# Patient Record
Sex: Male | Born: 1990 | Race: White | Hispanic: No | Smoking: Never smoker
Health system: Southern US, Community
[De-identification: ages and names within clinical notes are randomized; demographics above are authoritative.]

## PROBLEM LIST (undated history)

## (undated) HISTORY — PX: WISDOM TOOTH EXTRACTION: SHX21

---

## 2019-06-28 ENCOUNTER — Emergency Department (HOSPITAL_COMMUNITY)
Admission: EM | Admit: 2019-06-28 | Discharge: 2019-06-28 | Disposition: A | Discharge status: Home / Self Care | Payer: Medicaid Other | Attending: Emergency Medicine | Admitting: Emergency Medicine

## 2019-06-28 ENCOUNTER — Other Ambulatory Visit: Payer: Self-pay

## 2019-06-28 ENCOUNTER — Emergency Department (HOSPITAL_COMMUNITY): Payer: Medicaid Other

## 2019-06-28 ENCOUNTER — Encounter (HOSPITAL_COMMUNITY): Payer: Self-pay | Admitting: Emergency Medicine

## 2019-06-28 DIAGNOSIS — Y999 Unspecified external cause status: Secondary | ICD-10-CM | POA: Diagnosis not present

## 2019-06-28 DIAGNOSIS — Y929 Unspecified place or not applicable: Secondary | ICD-10-CM | POA: Diagnosis not present

## 2019-06-28 DIAGNOSIS — S99921A Unspecified injury of right foot, initial encounter: Secondary | ICD-10-CM

## 2019-06-28 DIAGNOSIS — S93114A Dislocation of interphalangeal joint of right lesser toe(s), initial encounter: Secondary | ICD-10-CM | POA: Diagnosis not present

## 2019-06-28 DIAGNOSIS — Y9301 Activity, walking, marching and hiking: Secondary | ICD-10-CM | POA: Insufficient documentation

## 2019-06-28 DIAGNOSIS — Y29XXXA Contact with blunt object, undetermined intent, initial encounter: Secondary | ICD-10-CM | POA: Diagnosis not present

## 2019-06-28 DIAGNOSIS — T1490XA Injury, unspecified, initial encounter: Secondary | ICD-10-CM

## 2019-06-28 MED ORDER — OXYCODONE-ACETAMINOPHEN 5-325 MG PO TABS
1.0000 | ORAL_TABLET | Freq: Once | ORAL | Status: AC
  Administered 2019-06-28: 20:00:00 1 via ORAL
  Filled 2019-06-28: qty 1

## 2019-06-28 MED ORDER — LIDOCAINE HCL (PF) 1 % IJ SOLN
10.0000 mL | Freq: Once | INTRAMUSCULAR | Status: DC
  Filled 2019-06-28: qty 30

## 2019-06-28 NOTE — ED Provider Notes (Signed)
[name]-EMERGENCY DEPT Provider Note   CSN: [id number] Arrival date & time: 06/28/19  1628     History Chief Complaint  Patient presents with  . Toe Injury    Corey Buck is a 29 y.o. male.  Corey Buck is a 29 year old man who presents to the ED with an injury to a toe in his right foot.  He reports that he accidentally kicked a table leg earlier today and his toe seems to be out of alignment.  He is suspicious that he may have dislocated his toe and he wants to make sure that it is not broken.  He has no other concerns at this time.        History reviewed. No pertinent past medical history.  There are no problems to display for this patient.   Past Surgical History:  Procedure Laterality Date  . WISDOM TOOTH EXTRACTION         No family history on file.  Social History   Tobacco Use  . Smoking status: Not on file  Substance Use Topics  . Alcohol use: Not on file  . Drug use: Not on file    Home Medications Prior to Admission medications   Not on File    Allergies    Patient has no known allergies.  Review of Systems   Review of Systems  All other systems reviewed and are negative.   Physical Exam Updated Vital Signs BP 126/70   Pulse 63   Temp 98.1 F (36.7 C) (Oral)   Resp 16   SpO2 100%   Physical Exam Constitutional:      Appearance: Normal appearance. He is normal weight.  HENT:     Head: Normocephalic and atraumatic.     Nose: Nose normal.     Mouth/Throat:     Mouth: Mucous membranes are moist.     Pharynx: Oropharynx is clear.  Eyes:     Conjunctiva/sclera: Conjunctivae normal.     Pupils: Pupils are equal, round, and reactive to light.  Pulmonary:     Effort: Pulmonary effort is normal. No respiratory distress.  Musculoskeletal:        General: Deformity present. No swelling.     Cervical back: Normal range of motion and neck supple.     Right lower leg: No edema.     Comments: The fourth toe of  his right foot shows some superior displacement.  No evidence of broken skin, erythema, swelling.  Skin:    General: Skin is warm.  Neurological:     General: No focal deficit present.     Mental Status: He is alert. Mental status is at baseline.     ED Results / Procedures / Treatments   Labs (all labs ordered are listed, but only abnormal results are displayed) Labs Reviewed - No data to display  EKG None  Radiology DG Foot Complete Right  Result Date: 06/28/2019 CLINICAL DATA:  Right fourth toe pain EXAM: RIGHT FOOT COMPLETE - 3+ VIEW COMPARISON:  None. FINDINGS: Abnormal appearance of the fourth digit proximal interphalangeal joint which appears to be dorsally subluxed, possibly dislocated. There is a suspected fracture at the base of the fourth middle phalanx. Remaining osseous structures appear intact and unremarkable. There is soft tissue swelling of the fourth digit. No arthropathy. IMPRESSION: Abnormal appearance of the fourth digit PIP joint which appears to be dorsally subluxed, possibly dislocated. Suspected fracture at the base of the fourth middle phalanx. Dedicated radiographs of the  fourth toe would be helpful to further characterize. Electronically Signed   By: Davina Poke D.O.   On: 06/28/2019 16:52   DG Toe 4th Right  Result Date: 06/28/2019 CLINICAL DATA:  Fourth toe injury today.  Post reduction. EXAM: RIGHT FOURTH TOE COMPARISON:  Earlier same day. FINDINGS: There is been reduction of the previously seen subluxation at the fourth PIP joint with current normal alignment over the joint. No evidence of fracture. IMPRESSION: Normal alignment post reduction of previously seen subluxation at the fourth PIP joint. No fracture. Electronically Signed   By: Marin Olp M.D.   On: 06/28/2019 19:52    Procedures Procedures (including critical care time)  Medications Ordered in ED Medications  oxyCODONE-acetaminophen (PERCOCET/ROXICET) 5-325 MG per tablet 1 tablet (1  tablet Oral Given 06/28/19 1939)    ED Course  I have reviewed the triage vital signs and the nursing notes.  Pertinent labs & imaging results that were available during my care of the patient were reviewed by me and considered in my medical decision making (see chart for details).    MDM Rules/Calculators/A&P                      Mr. Ocallaghan presents to the ED with a right toe injury.  Initial x-rays showed dislocation and were inconclusive for fracture.  The dislocation was reduced and repeat x-rays showed normal orientation of his right fourth toe with no evidence of fracture.  His toe was buddy taped and he was instructed to wear stiff soled shoes for the next several days.  Final Clinical Impression(s) / ED Diagnoses Final diagnoses:  Injury  Injury of toe on right foot, initial encounter    Rx / DC Orders ED Discharge Orders    None       Matilde Haymaker, MD 06/28/19 2010    Margette Fast, MD 07/02/19 [phone]

## 2019-06-28 NOTE — ED Triage Notes (Signed)
Pt reports hit right 4th toe on table today. Believes is broken.

## 2019-06-28 NOTE — Discharge Instructions (Signed)
Our x-rays show that there is no evidence of fracture of your toe.  We were able to correct the alignment of your toe, it is no longer dislocated.  At home, you can use Tylenol and Motrin for discomfort, you will likely have discomfort for the next several days.

## 2019-07-02 NOTE — ED Provider Notes (Signed)
Reduction of dislocation  Date/Time: 07/02/2019 7:20 AM Performed by: Maia Plan, MD Authorized by: Maia Plan, MD  Consent: Verbal consent obtained. Risks and benefits: risks, benefits and alternatives were discussed Consent given by: patient Time out: Immediately prior to procedure a "time out" was called to verify the correct patient, procedure, equipment, support staff and site/side marked as required. Local anesthesia used: no  Anesthesia: Local anesthesia used: no  Sedation: Patient sedated: no  Patient tolerance: patient tolerated the procedure well with no immediate complications Comments: Inline traction applied tot he toe with palpable reduction and improved deformity. Joint reduction confirmed on repeat films.        Maia Plan, MD 07/02/19 [phone]

## 2021-03-05 ENCOUNTER — Other Ambulatory Visit: Payer: Self-pay

## 2021-03-05 ENCOUNTER — Emergency Department (HOSPITAL_COMMUNITY)
Admission: EM | Admit: 2021-03-05 | Discharge: 2021-03-05 | Disposition: A | Discharge status: Home / Self Care | Payer: Medicaid Other | Attending: Emergency Medicine | Admitting: Emergency Medicine

## 2021-03-05 ENCOUNTER — Encounter (HOSPITAL_COMMUNITY): Payer: Self-pay

## 2021-03-05 DIAGNOSIS — Z20822 Contact with and (suspected) exposure to covid-19: Secondary | ICD-10-CM | POA: Diagnosis not present

## 2021-03-05 DIAGNOSIS — J101 Influenza due to other identified influenza virus with other respiratory manifestations: Secondary | ICD-10-CM | POA: Insufficient documentation

## 2021-03-05 DIAGNOSIS — R509 Fever, unspecified: Secondary | ICD-10-CM | POA: Diagnosis present

## 2021-03-05 LAB — RESP PANEL BY RT-PCR (FLU A&B, COVID) ARPGX2
Influenza A by PCR: POSITIVE — AB
Influenza B by PCR: NEGATIVE
SARS Coronavirus 2 by RT PCR: NEGATIVE

## 2021-03-05 MED ORDER — OSELTAMIVIR PHOSPHATE 75 MG PO CAPS: 75.0000 mg | ORAL_CAPSULE | Freq: Two times a day (BID) | ORAL | 0 refills | Status: AC

## 2021-03-05 NOTE — ED Triage Notes (Signed)
Pt c/o cough, sore throat, headache, chills, and body aches.

## 2021-03-05 NOTE — Discharge Instructions (Signed)
Your flu a positive.  I have sent in Tamiflu to your pharmacy.  As discussed continue drinking plenty of fluids.  Take Tylenol and Motrin to keep fever under control.  If you develop nausea and vomiting to the point you are unable to keep any food or drink down despite taking Zofran please return to the emergency room.  We did discuss sending in Zofran to your pharmacy which you stated you have plenty on hand.

## 2021-03-05 NOTE — ED Provider Notes (Signed)
[facility]-EMERGENCY DEPT Provider Note   CSN: [id number] Arrival date & time: 03/05/21  1307     History Chief Complaint  Patient presents with   Headache   Sore Throat   Cough   Generalized Body Aches    Corey Buck is a 30 y.o. male.  30 year old male presents today for evaluation of fever, myalgias, headache, sore throat of 1 day duration.  Patient denies any known recent sick contacts.  He denies vomiting.  He reports adequate hydration with 4 16 ounce bottles.  He does report mild nausea for which he took Zofran that he has at home.  He denies any chest pain, shortness of breath, lightheadedness.  He has not taken anything over-the-counter for this.  The history is provided by the patient. No language interpreter was used.      History reviewed. No pertinent past medical history.  There are no problems to display for this patient.   Past Surgical History:  Procedure Laterality Date   WISDOM TOOTH EXTRACTION         History reviewed. No pertinent family history.  Social History   Tobacco Use   Smoking status: Never   Smokeless tobacco: Never  Substance Use Topics   Alcohol use: Not Currently    Home Medications Prior to Admission medications   Not on File    Allergies    Patient has no known allergies.  Review of Systems   Review of Systems  Constitutional:  Negative for chills and fever.  Cardiovascular:  Negative for chest pain.  Gastrointestinal:  Positive for nausea. Negative for abdominal pain and vomiting.  Musculoskeletal:  Positive for myalgias.  Neurological:  Negative for weakness and light-headedness.  All other systems reviewed and are negative.  Physical Exam Updated Vital Signs BP 121/64   Pulse 89   Temp 99.4 F (37.4 C) (Oral)   Resp 16   SpO2 97%   Physical Exam Vitals and nursing note reviewed.  Constitutional:      General: He is not in acute distress.    Appearance: Normal appearance. He is  not ill-appearing.  HENT:     Head: Normocephalic and atraumatic.     Nose: Nose normal.  Eyes:     General: No scleral icterus.    Extraocular Movements: Extraocular movements intact.     Conjunctiva/sclera: Conjunctivae normal.  Cardiovascular:     Rate and Rhythm: Normal rate and regular rhythm.     Pulses: Normal pulses.     Heart sounds: Normal heart sounds.  Pulmonary:     Effort: Pulmonary effort is normal. No respiratory distress.     Breath sounds: Normal breath sounds. No wheezing or rales.  Abdominal:     General: There is no distension.     Tenderness: There is no abdominal tenderness.  Musculoskeletal:        General: Normal range of motion.     Cervical back: Normal range of motion.     Right lower leg: No edema.     Left lower leg: No edema.  Skin:    General: Skin is warm and dry.  Neurological:     General: No focal deficit present.     Mental Status: He is alert. Mental status is at baseline.    ED Results / Procedures / Treatments   Labs (all labs ordered are listed, but only abnormal results are displayed) Labs Reviewed  RESP PANEL BY RT-PCR (FLU A&B, COVID) [id number]    EKG  None  Radiology No results found.  Procedures Procedures   Medications Ordered in ED Medications - No data to display  ED Course  I have reviewed the triage vital signs and the nursing notes.  Pertinent labs & imaging results that were available during my care of the patient were reviewed by me and considered in my medical decision making (see chart for details).    MDM Rules/Calculators/A&P                           30 year old male presents with congestion, myalgias, headache, fever of 1 day duration.  He is afebrile on presentation with mild tachycardia.  He reports adequate hydration.  Does have Zofran at home that he has taken a dose of for mild nausea.  Reports he has adequate Zofran if he needs more.  Will obtain COVID and flu panel.   Respiratory panel positive  for influenza A.  Tamiflu prescribed.  Symptomatic treatment discussed.  Return precautions discussed.  Patient voices understanding and is in agreement with plan.  Final Clinical Impression(s) / ED Diagnoses Final diagnoses:  None    Rx / DC Orders ED Discharge Orders     None        Marita Kansas, PA-C 03/05/21 1537    Gwyneth Sprout, MD 03/06/21 1129

## 2021-10-22 IMAGING — CR DG TOE 4TH 2+V*R*
3 series · 3 of 3 positions shown · non-contrast
Comparison: Earlier same day.

CLINICAL DATA: Fourth toe injury today.  Post reduction.

EXAM:
RIGHT FOURTH TOE

[x toes ap right]
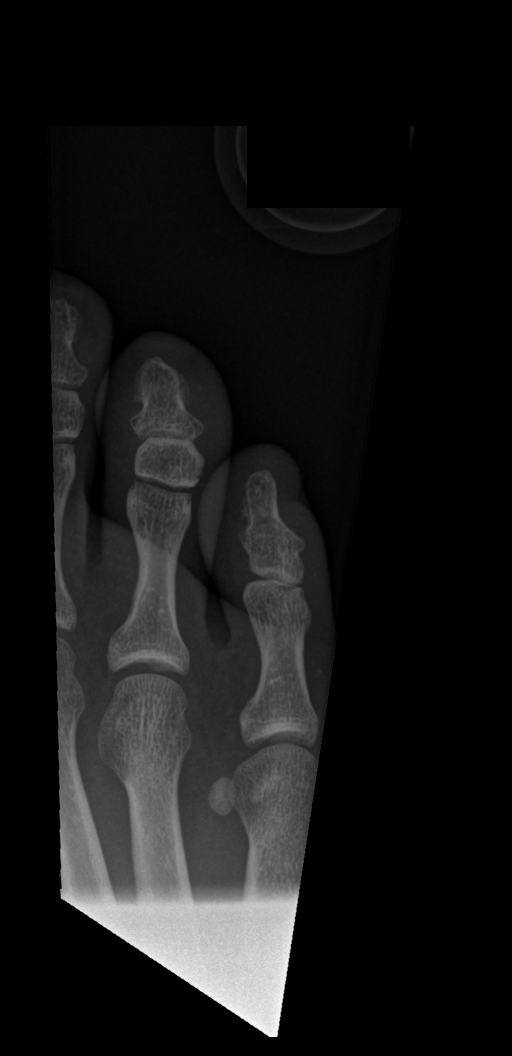

[x toes obl right]
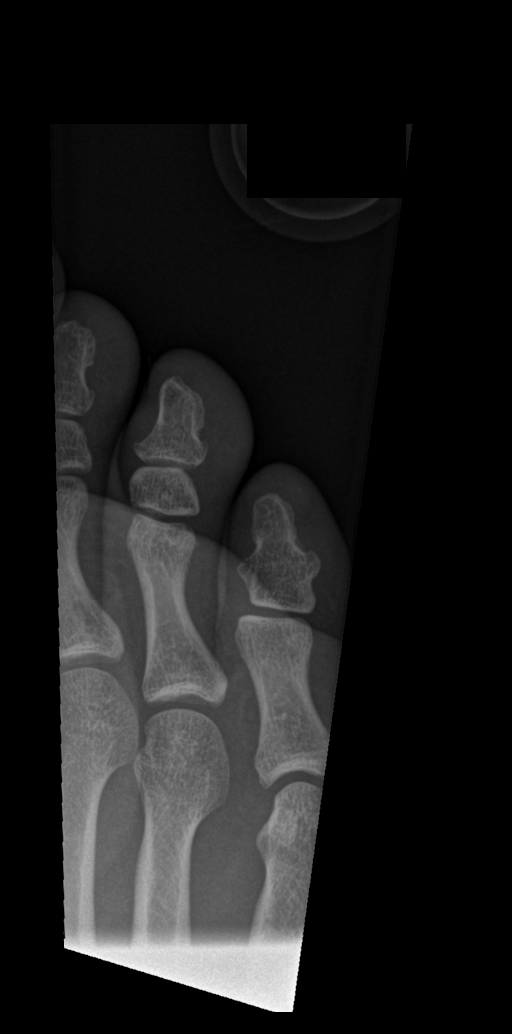

[x toes lat right]
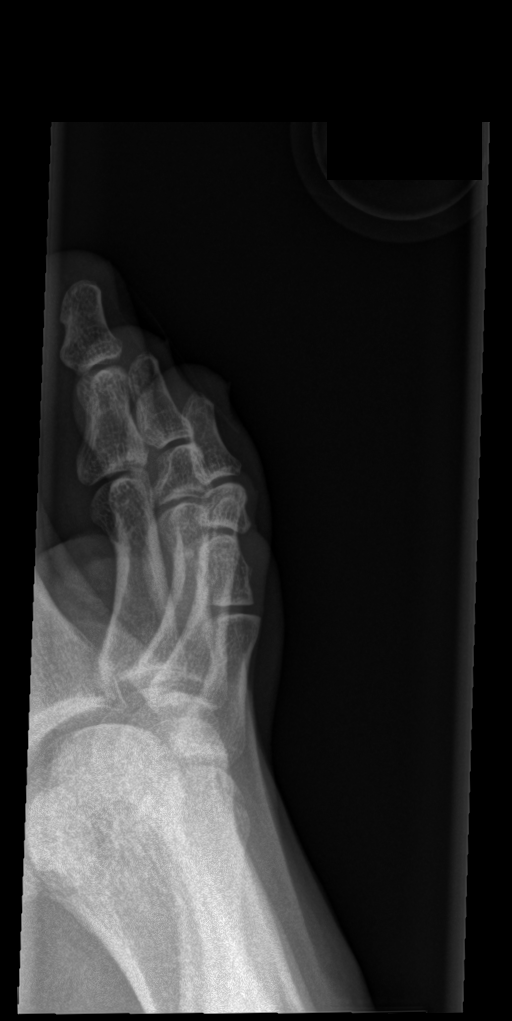

[3 of 3 positions shown; findings below may reference images not displayed]

FINDINGS: There is been reduction of the previously seen subluxation at the
fourth PIP joint with current normal alignment over the joint. No
evidence of fracture.
IMPRESSION: Normal alignment post reduction of previously seen subluxation at
the fourth PIP joint. No fracture.

## 2021-10-22 IMAGING — CR DG FOOT COMPLETE 3+V*R*
3 series · 3 of 3 positions shown · non-contrast
Comparison: None.

CLINICAL DATA: Right fourth toe pain

EXAM:
RIGHT FOOT COMPLETE - 3+ VIEW

[x foot ap right]
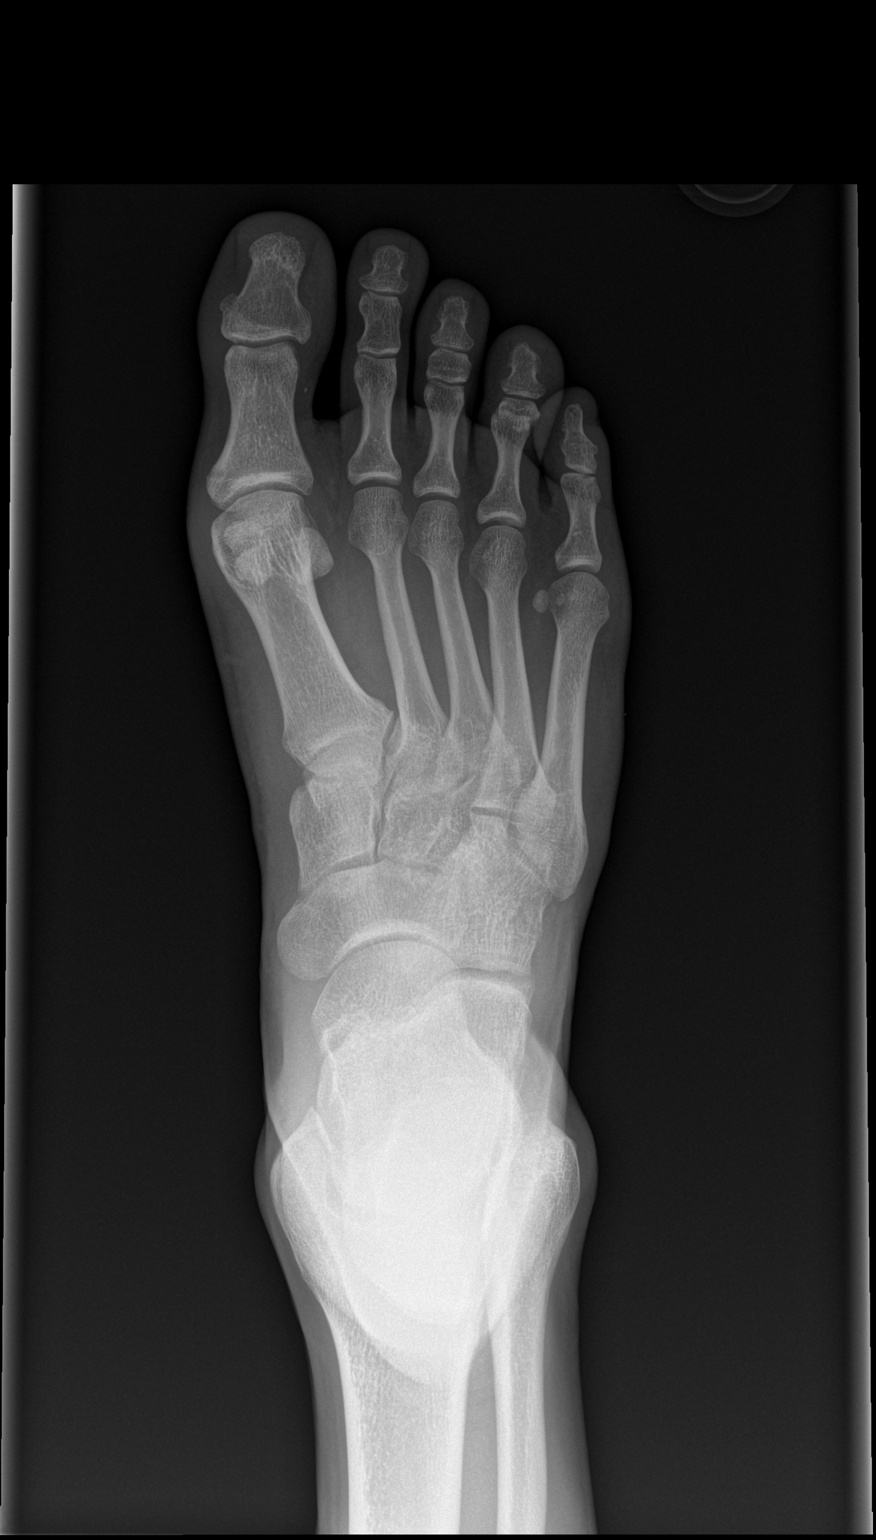

[x foot obl right]
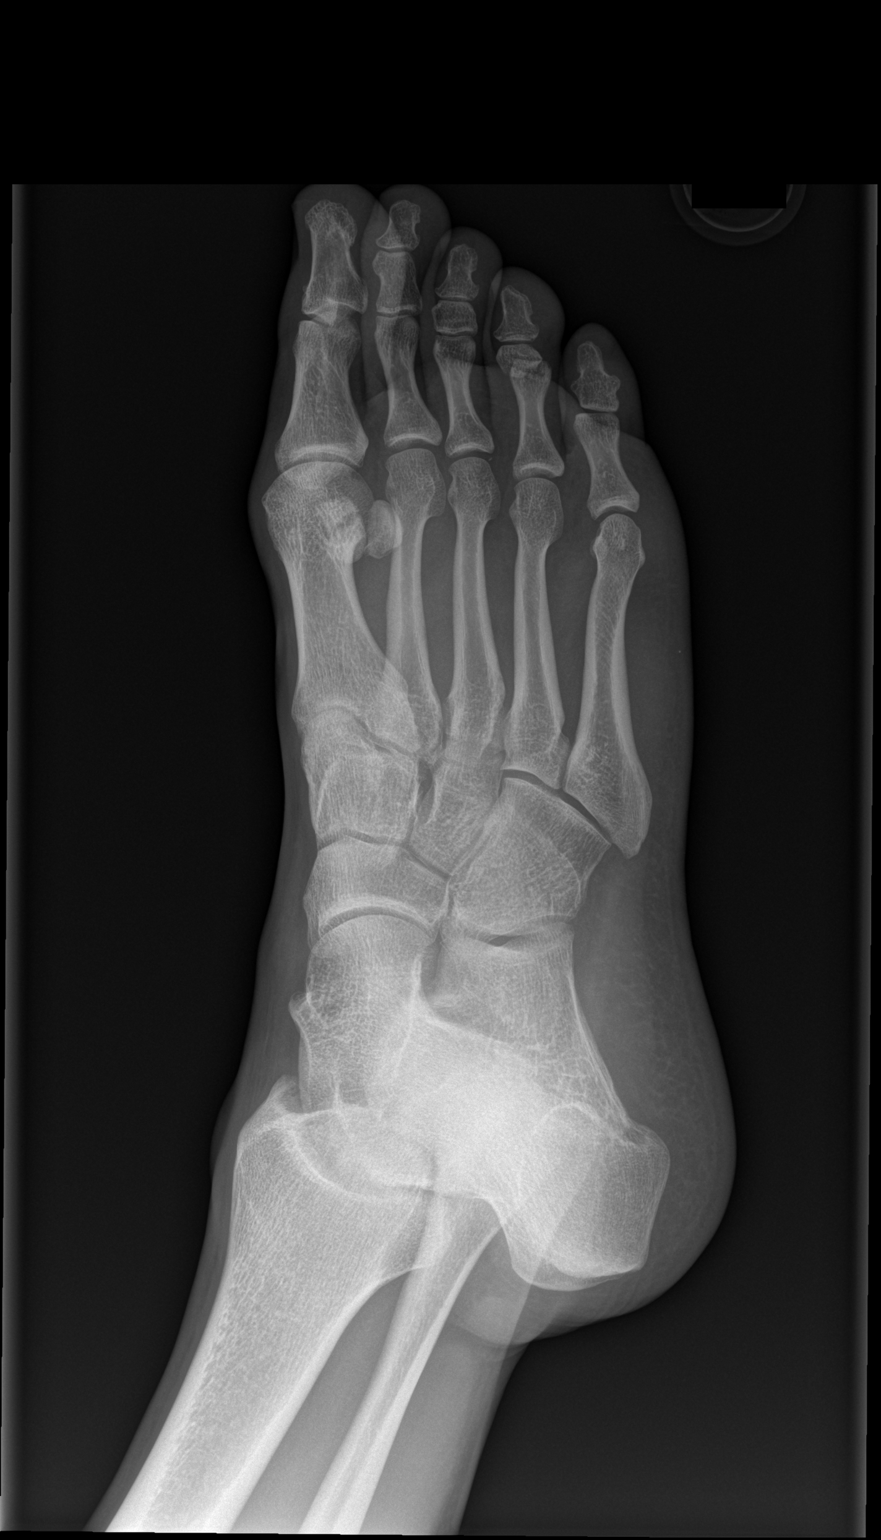

[x foot lat right]
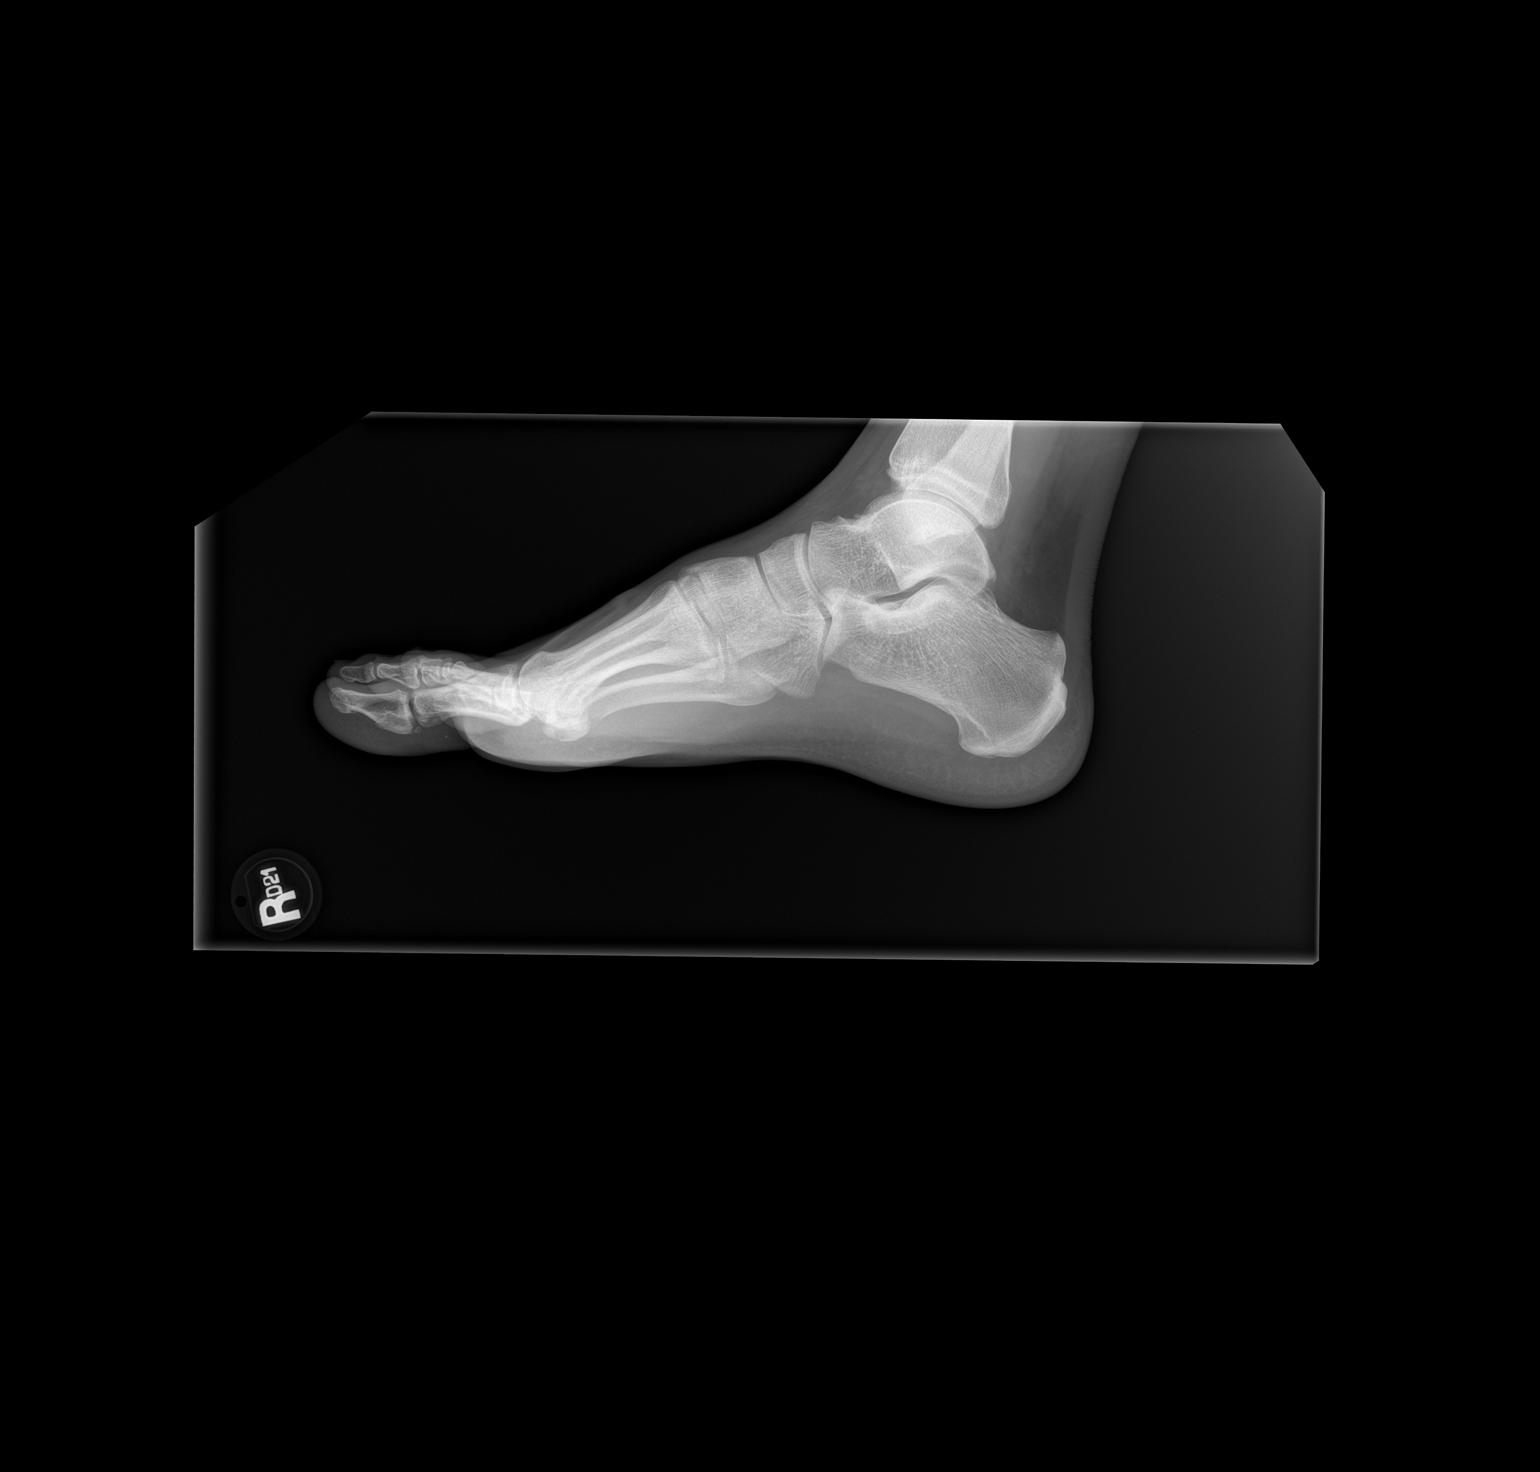

[3 of 3 positions shown; findings below may reference images not displayed]

FINDINGS: Abnormal appearance of the fourth digit proximal interphalangeal
joint which appears to be dorsally subluxed, possibly dislocated.
There is a suspected fracture at the base of the fourth middle
phalanx. Remaining osseous structures appear intact and
unremarkable. There is soft tissue swelling of the fourth digit. No
arthropathy.
IMPRESSION: Abnormal appearance of the fourth digit PIP joint which appears to
be dorsally subluxed, possibly dislocated. Suspected fracture at the
base of the fourth middle phalanx. Dedicated radiographs of the
fourth toe would be helpful to further characterize.
